# Patient Record
Sex: Female | Born: 2000 | Race: Black or African American | Hispanic: No | Marital: Single | Smoking: Never smoker
Health system: Southern US, Community
[De-identification: ages and names within clinical notes are randomized; demographics above are authoritative.]

---

## 2021-11-27 ENCOUNTER — Other Ambulatory Visit: Payer: Self-pay

## 2021-11-27 ENCOUNTER — Emergency Department (HOSPITAL_COMMUNITY)
Admission: EM | Admit: 2021-11-27 | Discharge: 2021-11-27 | Disposition: A | Discharge status: Home / Self Care | Payer: Medicaid Other | Attending: Emergency Medicine | Admitting: Emergency Medicine

## 2021-11-27 ENCOUNTER — Encounter (HOSPITAL_COMMUNITY): Payer: Self-pay | Admitting: Emergency Medicine

## 2021-11-27 ENCOUNTER — Emergency Department (HOSPITAL_COMMUNITY): Payer: Medicaid Other

## 2021-11-27 DIAGNOSIS — S71102A Unspecified open wound, left thigh, initial encounter: Secondary | ICD-10-CM | POA: Insufficient documentation

## 2021-11-27 DIAGNOSIS — W3400XA Accidental discharge from unspecified firearms or gun, initial encounter: Secondary | ICD-10-CM | POA: Insufficient documentation

## 2021-11-27 DIAGNOSIS — S79922A Unspecified injury of left thigh, initial encounter: Secondary | ICD-10-CM | POA: Diagnosis present

## 2021-11-27 LAB — CBC WITH DIFFERENTIAL/PLATELET
Abs Immature Granulocytes: 0.01 10*3/uL (ref 0.00–0.07)
Basophils Absolute: 0.1 10*3/uL (ref 0.0–0.1)
Basophils Relative: 1 %
Eosinophils Absolute: 0.3 10*3/uL (ref 0.0–0.5)
Eosinophils Relative: 4 %
HCT: 42.3 % (ref 36.0–46.0)
Hemoglobin: 14.4 g/dL (ref 12.0–15.0)
Immature Granulocytes: 0 %
Lymphocytes Relative: 46 %
Lymphs Abs: 3.8 10*3/uL (ref 0.7–4.0)
MCH: 29.9 pg (ref 26.0–34.0)
MCHC: 34 g/dL (ref 30.0–36.0)
MCV: 87.8 fL (ref 80.0–100.0)
Monocytes Absolute: 0.8 10*3/uL (ref 0.1–1.0)
Monocytes Relative: 10 %
Neutro Abs: 3.2 10*3/uL (ref 1.7–7.7)
Neutrophils Relative %: 39 %
Platelets: 401 10*3/uL — ABNORMAL HIGH (ref 150–400)
RBC: 4.82 MIL/uL (ref 3.87–5.11)
RDW: 13.2 % (ref 11.5–15.5)
WBC: 8.2 10*3/uL (ref 4.0–10.5)
nRBC: 0 % (ref 0.0–0.2)

## 2021-11-27 LAB — BASIC METABOLIC PANEL
Anion gap: 9 (ref 5–15)
BUN: 8 mg/dL (ref 6–20)
CO2: 25 mmol/L (ref 22–32)
Calcium: 9.2 mg/dL (ref 8.9–10.3)
Chloride: 106 mmol/L (ref 98–111)
Creatinine, Ser: 0.79 mg/dL (ref 0.44–1.00)
GFR, Estimated: >60 mL/min (ref >60)
Glucose, Bld: 105 mg/dL — ABNORMAL HIGH (ref 70–99)
Potassium: 3.8 mmol/L (ref 3.5–5.1)
Sodium: 140 mmol/L (ref 135–145)

## 2021-11-27 LAB — I-STAT BETA HCG BLOOD, ED (MC, WL, AP ONLY): I-stat hCG, quantitative: <5 m[IU]/mL (ref <5)

## 2021-11-27 MED ORDER — TETANUS-DIPHTH-ACELL PERTUSSIS 5-2.5-18.5 LF-MCG/0.5 IM SUSY
0.5000 mL | PREFILLED_SYRINGE | Freq: Once | INTRAMUSCULAR | Status: DC
  Filled 2021-11-27: qty 0.5

## 2021-11-27 MED ORDER — IBUPROFEN 600 MG PO TABS: 600.0000 mg | ORAL_TABLET | Freq: Four times a day (QID) | ORAL | 0 refills | Status: AC | PRN

## 2021-11-27 MED ORDER — SODIUM CHLORIDE 0.9 % IV BOLUS
1000.0000 mL | Freq: Once | INTRAVENOUS | Status: AC
  Administered 2021-11-27: 1000 mL via INTRAVENOUS

## 2021-11-27 MED ORDER — HYDROCODONE-ACETAMINOPHEN 5-325 MG PO TABS: 1.0000 | ORAL_TABLET | ORAL | 0 refills | Status: AC | PRN

## 2021-11-27 MED ORDER — HYDROCODONE-ACETAMINOPHEN 5-325 MG PO TABS
1.0000 | ORAL_TABLET | Freq: Once | ORAL | Status: AC
  Administered 2021-11-27: 1 via ORAL
  Filled 2021-11-27: qty 1
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
## 2022-03-10 LAB — RESULTS CONSOLE HPV: CHL HPV: NEGATIVE

## 2022-03-10 LAB — HM PAP SMEAR

## 2023-04-26 ENCOUNTER — Other Ambulatory Visit: Payer: Medicaid Other
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
## 2023-08-01 ENCOUNTER — Emergency Department (HOSPITAL_BASED_OUTPATIENT_CLINIC_OR_DEPARTMENT_OTHER)
Admission: EM | Admit: 2023-08-01 | Discharge: 2023-08-01 | Disposition: A | Discharge status: Home / Self Care | Payer: Medicaid Other | Attending: Emergency Medicine | Admitting: Emergency Medicine

## 2023-08-01 ENCOUNTER — Other Ambulatory Visit: Payer: Self-pay

## 2023-08-01 ENCOUNTER — Encounter (HOSPITAL_BASED_OUTPATIENT_CLINIC_OR_DEPARTMENT_OTHER): Payer: Self-pay | Admitting: Emergency Medicine

## 2023-08-01 DIAGNOSIS — K029 Dental caries, unspecified: Secondary | ICD-10-CM | POA: Diagnosis not present

## 2023-08-01 DIAGNOSIS — K0889 Other specified disorders of teeth and supporting structures: Secondary | ICD-10-CM | POA: Diagnosis present

## 2023-08-01 MED ORDER — FLUCONAZOLE 150 MG PO TABS: 150.0000 mg | ORAL_TABLET | Freq: Once | ORAL | 0 refills | Status: AC

## 2023-08-01 MED ORDER — LIDOCAINE VISCOUS HCL 2 % MT SOLN: 5.0000 mL | Freq: Three times a day (TID) | OROMUCOSAL | 0 refills | Status: AC

## 2023-08-01 MED ORDER — CLINDAMYCIN HCL 300 MG PO CAPS
300.0000 mg | ORAL_CAPSULE | Freq: Four times a day (QID) | ORAL | 0 refills | Status: DC
Start: 2023-08-01 — End: 2023-08-01

## 2023-08-01 MED ORDER — NAPROXEN 500 MG PO TABS: 500.0000 mg | ORAL_TABLET | Freq: Two times a day (BID) | ORAL | 0 refills | Status: DC

## 2023-08-01 MED ORDER — LIDOCAINE VISCOUS HCL 2 % MT SOLN
15.0000 mL | Freq: Once | OROMUCOSAL | Status: AC
Start: 2023-08-01 — End: 2023-08-01
  Administered 2023-08-01: 15 mL via OROMUCOSAL
  Filled 2023-08-01: qty 15

## 2023-08-01 MED ORDER — NAPROXEN 250 MG PO TABS
500.0000 mg | ORAL_TABLET | Freq: Once | ORAL | Status: AC
  Administered 2023-08-01: 500 mg via ORAL
  Filled 2023-08-01: qty 2

## 2023-08-01 MED ORDER — CLINDAMYCIN HCL 300 MG PO CAPS: 300.0000 mg | ORAL_CAPSULE | Freq: Four times a day (QID) | ORAL | 0 refills | Status: AC

## 2023-08-01 MED ORDER — CLINDAMYCIN HCL 150 MG PO CAPS
300.0000 mg | ORAL_CAPSULE | Freq: Once | ORAL | Status: AC
  Administered 2023-08-01: 300 mg via ORAL
  Filled 2023-08-01: qty 2

## 2023-08-01 NOTE — ED Provider Notes (Signed)
 [place] EMERGENCY DEPARTMENT AT [facility] Provider Note   CSN: [id number] Arrival date & time: 08/01/23  2153     History  Chief Complaint  Patient presents with   Dental Pain    Pamela Lozano is a 23 y.o. female.  The history is provided by the patient.  Dental Pain Location:  Lower Lower teeth location:  30/RL 1st molar Quality:  Aching Severity:  Severe Onset quality:  Gradual Duration:  3 days Timing:  Constant Context: dental caries   Previous work-up:  Dental exam Relieved by:  Nothing Worsened by:  Nothing Associated symptoms: no facial swelling and no fever   Risk factors: no diabetes        Home Medications Prior to Admission medications   Medication Sig Start Date End Date Taking? Authorizing Provider  clindamycin  (CLEOCIN ) 300 MG capsule Take 1 capsule (300 mg total) by mouth 4 (four) times daily. X 7 days 08/01/23  Yes Trampas Stettner, MD  naproxen  (NAPROSYN ) 500 MG tablet Take 1 tablet (500 mg total) by mouth 2 (two) times daily with a meal. 08/01/23  Yes Tarvis Blossom, MD  HYDROcodone -acetaminophen  (NORCO/VICODIN) 5-325 MG tablet Take 1 tablet by mouth every 4 (four) hours as needed. 11/27/21   Haviland, Julie, MD  ibuprofen  (ADVIL ) 600 MG tablet Take 1 tablet (600 mg total) by mouth every 6 (six) hours as needed. 11/27/21   Dean Clarity, MD      Allergies    Amoxicillin    Review of Systems   Review of Systems  Constitutional:  Negative for fever.  HENT:  Positive for dental problem. Negative for facial swelling.   Eyes:  Negative for redness.  All other systems reviewed and are negative.   Physical Exam Updated Vital Signs BP 121/71 (BP Location: Left Arm)   Pulse 78   Temp 97.7 F (36.5 C)   Resp 18   Ht 5' 4 (1.626 m)   Wt 90.7 kg   LMP 07/25/2023 (Exact Date)   SpO2 100%   BMI 34.33 kg/m  Physical Exam Vitals and nursing note reviewed.  Constitutional:      General: She is not in acute distress.    Appearance:  She is well-developed.  HENT:     Head: Normocephalic and atraumatic.     Nose: Nose normal.     Mouth/Throat:     Mouth: Mucous membranes are moist.     Pharynx: Oropharynx is clear.     Comments: Dental caries, RL 30 Eyes:     Pupils: Pupils are equal, round, and reactive to light.  Cardiovascular:     Rate and Rhythm: Normal rate and regular rhythm.     Pulses: Normal pulses.     Heart sounds: Normal heart sounds.  Pulmonary:     Effort: Pulmonary effort is normal. No respiratory distress.     Breath sounds: Normal breath sounds.  Abdominal:     General: Bowel sounds are normal. There is no distension.     Palpations: Abdomen is soft.     Tenderness: There is no abdominal tenderness. There is no guarding or rebound.  Musculoskeletal:        General: Normal range of motion.     Cervical back: Neck supple.  Skin:    General: Skin is dry.     Capillary Refill: Capillary refill takes less than 2 seconds.     Findings: No erythema or rash.  Neurological:     General: No focal deficit present.  Deep Tendon Reflexes: Reflexes normal.  Psychiatric:        Mood and Affect: Mood normal.     ED Results / Procedures / Treatments   Labs (all labs ordered are listed, but only abnormal results are displayed) Labs Reviewed - No data to display  EKG None  Radiology No results found.  Procedures Procedures    Medications Ordered in ED Medications  clindamycin  (CLEOCIN ) capsule 300 mg (has no administration in time range)  naproxen  (NAPROSYN ) tablet 500 mg (has no administration in time range)    ED Course/ Medical Decision Making/ A&P                                 Medical Decision Making Patient with 3 days of dental pain   Amount and/or Complexity of Data Reviewed External Data Reviewed: notes.    Details: Previous notes reviewed   Risk Prescription drug management. Risk Details: Patient with 3 days of dental pain and has not seen her dentist in a while.   Will need follow up with dentistry for imaging and definitive care. Will start antibiotics and pain medication.      Final Clinical Impression(s) / ED Diagnoses Final diagnoses:  Pain due to dental caries   Return for intractable cough, coughing up blood, fevers > 100.4 unrelieved by medication, shortness of breath, intractable vomiting, chest pain, shortness of breath, weakness, numbness, changes in speech, facial asymmetry, abdominal pain, passing out, Inability to tolerate liquids or food, cough, altered mental status or any concerns. No signs of systemic illness or infection. The patient is nontoxic-appearing on exam and vital signs are within normal limits.  I have reviewed the triage vital signs and the nursing notes. Pertinent labs & imaging results that were available during my care of the patient were reviewed by me and considered in my medical decision making (see chart for details). After history, exam, and medical workup I feel the patient has been appropriately medically screened and is safe for discharge home. Pertinent diagnoses were discussed with the patient. Patient was given return precautions.  Rx / DC Orders ED Discharge Orders          Ordered    clindamycin  (CLEOCIN ) 300 MG capsule  4 times daily        08/01/23 2317    naproxen  (NAPROSYN ) 500 MG tablet  2 times daily with meals        08/01/23 2317              Damontre Millea, MD 08/01/23 2323

## 2023-08-01 NOTE — ED Triage Notes (Signed)
 Patient presents with right lower dental pain x 3 days. States "feels like its my wisdom tooth"

## 2023-09-19 ENCOUNTER — Other Ambulatory Visit: Payer: Self-pay

## 2023-09-19 ENCOUNTER — Emergency Department
Admission: EM | Admit: 2023-09-19 | Discharge: 2023-09-19 | Discharge status: Against Medical Advice | Attending: Emergency Medicine | Admitting: Emergency Medicine

## 2023-09-19 DIAGNOSIS — Z20822 Contact with and (suspected) exposure to covid-19: Secondary | ICD-10-CM | POA: Insufficient documentation

## 2023-09-19 DIAGNOSIS — R509 Fever, unspecified: Secondary | ICD-10-CM | POA: Insufficient documentation

## 2023-09-19 DIAGNOSIS — Z5321 Procedure and treatment not carried out due to patient leaving prior to being seen by health care provider: Secondary | ICD-10-CM | POA: Diagnosis not present

## 2023-09-19 DIAGNOSIS — R059 Cough, unspecified: Secondary | ICD-10-CM | POA: Insufficient documentation

## 2023-09-19 DIAGNOSIS — R519 Headache, unspecified: Secondary | ICD-10-CM | POA: Diagnosis not present

## 2023-09-19 LAB — RESP PANEL BY RT-PCR (RSV, FLU A&B, COVID)  RVPGX2
Influenza A by PCR: NEGATIVE
Influenza B by PCR: NEGATIVE
Resp Syncytial Virus by PCR: NEGATIVE
SARS Coronavirus 2 by RT PCR: NEGATIVE

## 2023-09-19 NOTE — ED Triage Notes (Signed)
 Patient states cough, headache, fever and chills; reports + exposure to Covid.

## 2023-09-19 NOTE — ED Notes (Signed)
 Pt seen leaving with gf.

## 2023-12-27 HISTORY — PX: WISDOM TOOTH EXTRACTION: SHX21

## 2024-01-10 ENCOUNTER — Encounter (HOSPITAL_COMMUNITY): Payer: Self-pay | Admitting: Emergency Medicine

## 2024-01-10 ENCOUNTER — Other Ambulatory Visit: Payer: Self-pay

## 2024-01-10 ENCOUNTER — Emergency Department (HOSPITAL_COMMUNITY)
Admission: EM | Admit: 2024-01-10 | Discharge: 2024-01-11 | Disposition: A | Discharge status: Other Acute Inpatient Hospital | Attending: Emergency Medicine | Admitting: Emergency Medicine

## 2024-01-10 DIAGNOSIS — R220 Localized swelling, mass and lump, head: Secondary | ICD-10-CM | POA: Diagnosis present

## 2024-01-10 DIAGNOSIS — K047 Periapical abscess without sinus: Secondary | ICD-10-CM | POA: Diagnosis not present

## 2024-01-10 LAB — CBC WITH DIFFERENTIAL/PLATELET
Abs Immature Granulocytes: 0.1 10*3/uL — ABNORMAL HIGH (ref 0.00–0.07)
Basophils Absolute: 0.1 10*3/uL (ref 0.0–0.1)
Basophils Relative: 1 %
Eosinophils Absolute: 0.1 10*3/uL (ref 0.0–0.5)
Eosinophils Relative: 1 %
HCT: 43 % (ref 36.0–46.0)
Hemoglobin: 13.9 g/dL (ref 12.0–15.0)
Immature Granulocytes: 1 %
Lymphocytes Relative: 20 %
Lymphs Abs: 3.6 10*3/uL (ref 0.7–4.0)
MCH: 29.6 pg (ref 26.0–34.0)
MCHC: 32.3 g/dL (ref 30.0–36.0)
MCV: 91.7 fL (ref 80.0–100.0)
Monocytes Absolute: 1.7 10*3/uL — ABNORMAL HIGH (ref 0.1–1.0)
Monocytes Relative: 10 %
Neutro Abs: 12.2 10*3/uL — ABNORMAL HIGH (ref 1.7–7.7)
Neutrophils Relative %: 67 %
Platelets: 543 10*3/uL — ABNORMAL HIGH (ref 150–400)
RBC: 4.69 MIL/uL (ref 3.87–5.11)
RDW: 13.2 % (ref 11.5–15.5)
WBC: 17.8 10*3/uL — ABNORMAL HIGH (ref 4.0–10.5)
nRBC: 0 % (ref 0.0–0.2)

## 2024-01-10 LAB — BASIC METABOLIC PANEL WITH GFR
Anion gap: 11 (ref 5–15)
BUN: 11 mg/dL (ref 6–20)
CO2: 24 mmol/L (ref 22–32)
Calcium: 9.5 mg/dL (ref 8.9–10.3)
Chloride: 104 mmol/L (ref 98–111)
Creatinine, Ser: 0.77 mg/dL (ref 0.44–1.00)
GFR, Estimated: >60 mL/min (ref >60)
Glucose, Bld: 87 mg/dL (ref 70–99)
Potassium: 3.8 mmol/L (ref 3.5–5.1)
Sodium: 139 mmol/L (ref 135–145)

## 2024-01-10 NOTE — ED Notes (Signed)
 Pt has had MSE by Lauraine, PA and cleared to go to lobby.

## 2024-01-10 NOTE — ED Triage Notes (Signed)
 Pt to ED via [facility] from home c/o dental pain and right facial swelling.  Pt had infected wisdom teeth and had wisdom teeth extraction on 6/10.  Continued pain and swelling and placed on abx without improvement.  Pt unable to open mouth, tolerating liquids, denies difficulty breathing.  Reports 101 fever at home today and took ibuprofen .

## 2024-01-10 NOTE — ED Provider Triage Note (Signed)
 Emergency Medicine Provider Triage Evaluation Note  Pamela Lozano , a 23 y.o. female  was evaluated in triage.  Pt complains of facial abscess. States she was admitted for this and had OR drainage 1 week ago and her swelling is back. Notes she had a fever at home. Has difficulty opening her mouth.  Painful swallowing but is tolerating secretions.  Normal phonation.  Review of Systems  Positive:  Negative:   Physical Exam  BP 96/75 (BP Location: Right Arm)   Pulse (!) 103   Temp 98.4 F (36.9 C) (Axillary)   Resp 16   Ht 5' 4 (1.626 m)   Wt 89.8 kg   LMP 12/27/2023 (Exact Date)   SpO2 98%   BMI 33.99 kg/m  Gen:   Awake, no distress   Resp:  Normal effort  MSK:   Moves extremities without difficulty  Other:  R sided facial swelling, has difficulty opening her mouth beyond 2 finger breadths and therefore exam is limited. Tolerating secretions, normal phonation  Medical Decision Making  Medically screening exam initiated at 9:40 PM.  Appropriate orders placed.  Pamela Lozano was informed that the remainder of the evaluation will be completed by another provider, this initial triage assessment does not replace that evaluation, and the importance of remaining in the ED until their evaluation is complete.  Work-up initiated   Pamela Lozano A, PA-C 01/10/24 2143

## 2024-01-11 ENCOUNTER — Encounter (HOSPITAL_COMMUNITY): Payer: Self-pay

## 2024-01-11 ENCOUNTER — Emergency Department (HOSPITAL_COMMUNITY)

## 2024-01-11 DIAGNOSIS — I959 Hypotension, unspecified: Secondary | ICD-10-CM | POA: Diagnosis not present

## 2024-01-11 DIAGNOSIS — R531 Weakness: Secondary | ICD-10-CM | POA: Diagnosis not present

## 2024-01-11 DIAGNOSIS — Z743 Need for continuous supervision: Secondary | ICD-10-CM | POA: Diagnosis not present

## 2024-01-11 MED ORDER — ACETAMINOPHEN 10 MG/ML IV SOLN
1000.0000 mg | Freq: Four times a day (QID) | INTRAVENOUS | Status: DC
  Administered 2024-01-11 (×2): 1000 mg via INTRAVENOUS
  Filled 2024-01-11 (×2): qty 100

## 2024-01-11 MED ORDER — CLINDAMYCIN PHOSPHATE 900 MG/50ML IV SOLN
900.0000 mg | Freq: Once | INTRAVENOUS | Status: AC
  Administered 2024-01-11: 900 mg via INTRAVENOUS
  Filled 2024-01-11: qty 50

## 2024-01-11 MED ORDER — IOHEXOL 350 MG/ML SOLN
75.0000 mL | Freq: Once | INTRAVENOUS | Status: AC | PRN
  Administered 2024-01-11: 75 mL via INTRAVENOUS

## 2024-01-11 MED ORDER — LACTATED RINGERS IV BOLUS
1000.0000 mL | Freq: Once | INTRAVENOUS | Status: AC
  Administered 2024-01-11: 1000 mL via INTRAVENOUS

## 2024-01-11 MED ORDER — DEXAMETHASONE SODIUM PHOSPHATE 10 MG/ML IJ SOLN
10.0000 mg | Freq: Once | INTRAMUSCULAR | Status: AC
  Administered 2024-01-11: 10 mg via INTRAVENOUS
  Filled 2024-01-11: qty 1

## 2024-01-11 NOTE — ED Notes (Signed)
 Patient transported to CT

## 2024-01-11 NOTE — ED Notes (Addendum)
 Report given to Rosina Bourgeois RN at [facility].  Harlene Sar, MD (Accepting MD)  Report Phone number: [phone] option 2

## 2024-01-11 NOTE — ED Notes (Signed)
Patient asleep in bed at this time.

## 2024-01-11 NOTE — ED Notes (Signed)
 Updated time for [facility] is 11a-12p arrival

## 2024-01-11 NOTE — ED Notes (Signed)
 Patient ambulated to bathroom with no issues or complaints.

## 2024-01-11 NOTE — ED Provider Notes (Signed)
 [facility] Emergency Department Provider Note MRN:  [id number]  Arrival date & time: 01/11/24     Chief Complaint   Dental Pain   History of Present Illness   Pamela Lozano is a 23 y.o. year-old female presents to the ED with chief complaint of facial swelling.  Reports that on 6/7 she was seen for dental pain.  Ended up having wisdom teeth pulled.  Subsequently developed infection that led to large facial abscess requiring I&D by ENT at [facility] on 01/01/24.  States that she has been on antibiotics since then.  Reports that she had been doing well until a day ago when the swelling returned.  She reports significant pain and swelling.  She hasn't been able to open her mouth.  History provided by patient.   Review of Systems  Pertinent positive and negative review of systems noted in HPI.    Physical Exam   Vitals:   01/11/24 0345 01/11/24 0515  BP: 104/63 (!) 116/94  Pulse: 80 70  Resp: 20   Temp:    SpO2: 100% 99%    CONSTITUTIONAL:  uncomfortable-appearing, NAD NEURO:  Alert and oriented x 3, CN 3-12 grossly intact EYES:  eyes equal and reactive ENT/NECK:  Supple, no stridor, significant facial swelling on the right, 0 finger trismus   CARDIO:  normal rate, regular rhythm, appears well-perfused  PULM:  No respiratory distress, CTAB GI/GU:  non-distended,  MSK/SPINE:  No gross deformities, no edema, moves all extremities  SKIN:  no rash, atraumatic   *Additional and/or pertinent findings included in MDM below  Diagnostic and Interventional Summary    EKG Interpretation Date/Time:    Ventricular Rate:    PR Interval:    QRS Duration:    QT Interval:    QTC Calculation:   R Axis:      Text Interpretation:         Labs Reviewed  CBC WITH DIFFERENTIAL/PLATELET - Abnormal; Notable for the following components:      Result Value   WBC 17.8 (*)    Platelets 543 (*)    Neutro Abs 12.2 (*)    Monocytes Absolute 1.7 (*)    Abs Immature  Granulocytes 0.10 (*)    All other components within normal limits  BASIC METABOLIC PANEL WITH GFR  CBC WITH DIFFERENTIAL/PLATELET    CT Maxillofacial W Contrast  Final Result      Medications  acetaminophen  (OFIRMEV ) IV 1,000 mg (0 mg Intravenous Stopped 01/11/24 0151)  clindamycin  (CLEOCIN ) IVPB 900 mg (0 mg Intravenous Stopped 01/11/24 0255)  lactated ringers bolus 1,000 mL (0 mLs Intravenous Stopped 01/11/24 0257)  dexamethasone (DECADRON) injection 10 mg (10 mg Intravenous Given 01/11/24 0152)  iohexol (OMNIPAQUE) 350 MG/ML injection 75 mL (75 mLs Intravenous Contrast Given 01/11/24 0220)     Procedures  /  Critical Care .Critical Care  Performed by: Vicky Charleston, PA-C Authorized by: Vicky Charleston, PA-C   Critical care provider statement:    Critical care time (minutes):  55   Critical care was time spent personally by me on the following activities:  Development of treatment plan with patient or surrogate, discussions with consultants, evaluation of patient's response to treatment, examination of patient, ordering and review of laboratory studies, ordering and review of radiographic studies, ordering and performing treatments and interventions, pulse oximetry, re-evaluation of patient's condition and review of old charts   ED Course and Medical Decision Making  I have reviewed the triage vital signs, the nursing notes,  and pertinent available records from the EMR.  Social Determinants Affecting Complexity of Care: Patient has no clinically significant social determinants affecting this chief complaint..   ED Course:    Medical Decision Making Patient here with right-sided facial swelling.  She had wisdom teeth removed 2 weeks ago.  Developed subsequent abscess which was incised and drained at [facility] by a Dr. Rudy on 6/15.  She states she has been taking outpatient antibiotics, but symptoms began to worsen over the past 2 days.  She has significant  swelling of the right face.  Repeat CT notable for 4 x 4 centimeter abscess that is odontogenic in origin extending up into the right masseter musculature.  After consulting with many different health systems, I was able to arrange for transfer to [facility] in [place].  [facility] is at capacity. [facility] is at capacity. [facility] and [facility] do not have oral surgery.  [facility] ENT declined transfer. ENT at [facility] declined consult due to the infection being odontogenic in nature.  Amount and/or Complexity of Data Reviewed Radiology: ordered.  Risk Prescription drug management.         Consultants: [phone] I consulted with Dr. Honora Largo, ENT at [facility], who declines transfer and recommends patient get definitive care by an Oral Surgeon, which they do not have access to at [facility].     Treatment and Plan: Patient's exam and diagnostic results are concerning for odontogenic facial abscess.  Feel that patient will need admission to the hospital for further treatment and evaluation.  Patient discussed with attending physician, Dr. Griselda, who agrees with plan.  Final Clinical Impressions(s) / ED Diagnoses     ICD-10-CM   1. Dental abscess  K04.7       ED Discharge Orders     None         Discharge Instructions Discussed with and Provided to Patient:   Discharge Instructions   None      Vicky Charleston, PA-C 01/11/24 9461    Griselda Norris, MD 01/11/24 [phone]

## 2024-01-11 NOTE — ED Notes (Signed)
 Per [employer] PA, give 0600 IV tylenol  @ 0730 due to first dose being given at 0130.

## 2024-04-26 ENCOUNTER — Other Ambulatory Visit: Payer: Self-pay

## 2024-04-27 ENCOUNTER — Encounter: Admitting: Obstetrics & Gynecology

## 2024-05-09 ENCOUNTER — Emergency Department (HOSPITAL_BASED_OUTPATIENT_CLINIC_OR_DEPARTMENT_OTHER)
Admission: EM | Admit: 2024-05-09 | Discharge: 2024-05-09 | Disposition: A | Discharge status: Home / Self Care | Payer: Self-pay | Attending: Emergency Medicine | Admitting: Emergency Medicine

## 2024-05-09 ENCOUNTER — Other Ambulatory Visit: Payer: Self-pay

## 2024-05-09 ENCOUNTER — Emergency Department (HOSPITAL_BASED_OUTPATIENT_CLINIC_OR_DEPARTMENT_OTHER): Payer: Self-pay

## 2024-05-09 ENCOUNTER — Encounter (HOSPITAL_BASED_OUTPATIENT_CLINIC_OR_DEPARTMENT_OTHER): Payer: Self-pay | Admitting: *Deleted

## 2024-05-09 DIAGNOSIS — Y92410 Unspecified street and highway as the place of occurrence of the external cause: Secondary | ICD-10-CM | POA: Diagnosis not present

## 2024-05-09 DIAGNOSIS — M542 Cervicalgia: Secondary | ICD-10-CM | POA: Diagnosis present

## 2024-05-09 DIAGNOSIS — M25551 Pain in right hip: Secondary | ICD-10-CM | POA: Diagnosis not present

## 2024-05-09 DIAGNOSIS — M25511 Pain in right shoulder: Secondary | ICD-10-CM | POA: Diagnosis not present

## 2024-05-09 LAB — PREGNANCY, URINE: Preg Test, Ur: NEGATIVE

## 2024-05-09 MED ORDER — NAPROXEN 500 MG PO TABS
500.0000 mg | ORAL_TABLET | Freq: Two times a day (BID) | ORAL | 0 refills | Status: AC
Start: 2024-05-09 — End: ?

## 2024-05-09 MED ORDER — LIDOCAINE 5 % EX PTCH: 1.0000 | MEDICATED_PATCH | CUTANEOUS | 0 refills | Status: AC

## 2024-05-09 MED ORDER — METHOCARBAMOL 500 MG PO TABS
500.0000 mg | ORAL_TABLET | Freq: Two times a day (BID) | ORAL | 0 refills | Status: AC
Start: 2024-05-09 — End: ?

## 2024-05-09 NOTE — Discharge Instructions (Signed)

## 2024-05-09 NOTE — ED Notes (Signed)
 Pt given urine cup and asked to collect sample
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
